# Patient Record
Sex: Female | Born: 2000 | Race: White | Hispanic: No | Marital: Single
Health system: Northeastern US, Academic
[De-identification: ages and names within clinical notes are randomized; demographics above are authoritative.]

---
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
## 2020-01-08 ENCOUNTER — Ambulatory Visit: Admitting: Student in an Organized Health Care Education/Training Program

## 2020-01-08 NOTE — Progress Notes (Signed)
* * *      Passey, Gloris **DOB:** [birth year] (19 yo F) **Acc No.** [id number] **DOS:**  01/08/2020    ---       Aundra Dubin**    ------    77 Y old Female, DOB: 05-Oct-2001    [address] [place], [place] [address]    Home: [phone]    Provider: Rolanda Jay        * * *    Telephone Encounter    ---    Answered by  Drue Novel Date: 01/08/2020       Time: 11:22 AM    Reason  appt reminder    ------            Action Taken                     Lewis,Trena  01/08/2020 11:22:46 AM > tried to outreach pt at [phone] not sure if number works tried to call it 3x's phone never called thru not ringing or anything. Couldn't outreach pt to remind about appt                    * * *                ---          * * *         Provider: Rolanda Jay 01/08/2020    ---    Note generated by eClinicalWorks EMR/PM Software ([email or web address])

## 2020-01-10 ENCOUNTER — Ambulatory Visit

## 2020-01-10 ENCOUNTER — Ambulatory Visit: Admitting: Student in an Organized Health Care Education/Training Program

## 2020-01-10 ENCOUNTER — Ambulatory Visit (HOSPITAL_BASED_OUTPATIENT_CLINIC_OR_DEPARTMENT_OTHER): Admitting: Psychiatry

## 2020-01-10 ENCOUNTER — Ambulatory Visit: Admit: 2020-01-10

## 2020-01-10 LAB — HX HEM-ROUTINE
HX BASO #: 0 10*3/uL (ref 0.0–0.2)
HX BASO: 1 %
HX EOSIN #: 0.1 10*3/uL (ref 0.0–0.5)
HX EOSIN: 2 %
HX HCT: 39.7 % (ref 32.0–45.0)
HX HGB: 12.6 g/dL (ref 11.0–15.0)
HX IMMATURE GRANULOCYTE#: 0 10*3/uL (ref 0.0–0.1)
HX IMMATURE GRANULOCYTE: 0 %
HX LYMPH #: 1.5 10*3/uL (ref 1.0–4.0)
HX LYMPH: 34 %
HX MCH: 27.6 pg (ref 26.0–34.0)
HX MCHC: 31.7 g/dL — ABNORMAL LOW (ref 32.0–36.0)
HX MCV: 87.1 fL (ref 80.0–98.0)
HX MONO #: 0.4 10*3/uL (ref 0.2–0.8)
HX MONO: 9 %
HX MPV: 9.6 fL (ref 9.1–11.7)
HX NEUT #: 2.4 10*3/uL (ref 1.5–7.5)
HX NRBC #: 0 10*3/uL
HX NUCLEATED RBC: 0 %
HX PLT: 272 10*3/uL (ref 150–400)
HX RBC BLOOD COUNT: 4.56 M/uL (ref 3.70–5.00)
HX RDW: 13.1 % (ref 11.5–14.5)
HX SEG NEUT: 54 %
HX WBC: 4.4 10*3/uL (ref 4.0–11.0)

## 2020-01-10 LAB — HX CHOLESTEROL
HX CHOLESTEROL: 172 mg/dL — ABNORMAL HIGH (ref 0–169)
HX HIGH DENSITY LIPOPROTEIN CHOL (HDL): 49 mg/dL (ref 35–75)
HX LDL: 101 mg/dL (ref 0–129)
HX TRIGLYCERIDES: 111 mg/dL (ref 20–149)

## 2020-01-10 LAB — HX CHEM-LIPIDS
HX CHOL-HDL RATIO: 3.5
HX CHOLESTEROL: 172 mg/dL — ABNORMAL HIGH (ref 0–169)
HX HIGH DENSITY LIPOPROTEIN CHOL (HDL): 49 mg/dL (ref 35–75)
HX HOURS FAST: 0 h
HX LDL: 101 mg/dL (ref 0–129)
HX TRIGLYCERIDES: 111 mg/dL (ref 20–149)

## 2020-01-10 LAB — HX HIV TESTING: HX HIV 1-2: NONREACTIVE

## 2020-01-10 MED ORDER — Ferrous Sulfate: 325 | 30 | Freq: Every day | 1 refills | 0 days | Status: AC

## 2020-01-10 MED ORDER — Naproxen: 500 | bottle | 0 refills | 0 days | Status: AC

## 2020-01-10 MED ORDER — Polyethylene Glycol 3350: 17 | 1 | Freq: Every day | 1 refills | 0 days | Status: AC

## 2020-01-10 NOTE — Progress Notes (Signed)
 .  Progress Notes  .  Patient: [name], [name]  Provider: Rolanda Jay    .DOB: [birth year] Age: 19 Y Sex: Female  .  PCP: Alphonzo Grieve, Glee Arvin  MD  Date: 01/10/2020  .  --------------------------------------------------------------------------------  .  REASON FOR APPOINTMENT  .  1. [facility]/ Heavy menstrual bleeding  .  HISTORY OF PRESENT ILLNESS  .  Depression Screening:  PHQ-2 (2015 Edition)  .  Marland Kitchen  Little interest or pleasure in doing things?Not at all  Feeling down, depressed, or hopeless?Not at all  Total Score0  .   [name] she is an 19 year old female with a medical history of  Panic attack on ( Escitalopram) General anxiety disorder  etsblished with a therapist present today for an concern of heavy  period bleeding x3 months. [name] reports she had her Neplanon  implanted on December 2021 . Two post insertion she reports she  has been experiencing heavy bleeding since she had her period on  10/25/2019. Sabella reports she change her pads every two hours ,  and sometimes blood soaked through her pads. 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  She states she is been feeling more tired, even this morning she  feel winded after walking from her Lyft to the clinic. She  reports lack of energy, taking more naps than usual. Also, she  reports having bilateral lower abdominal pain. constipation, and  Migraine. She takes Motrin which help with her pain. She denied  fever, back pain, diarrhea, nausea, vomiting. She is currently a  students at [employer] major in [employer].  .  Adolescent Med:  Current health concerns:  none.  .  Interval History:  negative.  .  CURRENT MEDICATIONS  .  Taking Lexapro  Taking Nexplanon  Taking Zyrtec  Medication List reviewed and reconciled with the patient  .  PAST MEDICAL HISTORY  .  Panic Attack  General Anxiety Disorder  .  ALLERGIES  .  N.K.D.A.  .  SURGICAL HISTORY  .  Knee surgery Nov/2020  .  FAMILY  HISTORY  .  Mother: alive, diagnosed with Diabetes mellitus without mention  of complication, type II or unspecified type, not stated as  uncontrolled  Father: alive  [place] smoker collapsed lung.  .  SOCIAL HISTORY  .  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 sleep : been  sleeping more.  Marland Kitchen  HOSPITALIZATION/MAJOR DIAGNOSTIC PROCEDURE  .  Knee surgery Nov 2020  .  REVIEW OF SYSTEMS  .  PED General:  .  General health description:    good .  Marland Kitchen  PED Behavioral/Developmental:  .  Body Image    no concerns .  Marland Kitchen  PED Constitutional:  .  Sleep    sleeping more than usual , no snoring, no apnea .  Marland Kitchen  PED Dermatology:  .  Acne No. Bruising No. Rash No.  .  PED HEENT/Resp:  .  HEENT    no concerns . Dental    regular care . Ears    normal  hearing . Cough No.  .  PED Cardiology:  .  Chest Pain No. Dizziness  No. Palpitations No.  .  PED GI/Nutrition:  .  Abdominal pain No. Diet    healthy diet . Constipation No.  Diarrhea No. Nausea No. Vomiting No.  .  PED Repro/Uro:  .  Urinary Frequency    no . Blood in Urine    no . Dysuria    no .  Marland Kitchen  PED Musculoskeletal/Neuro:  .  Headache    intermitent hedaches . Weakness No. Joint Pain No.  .  PED Endocrinology:  .  Endo Concerns    thyroid: no concerns .  Marland Kitchen  ADO OBGYN History:  .  Age at menarche:    10 . Cycle:    irregular . Flow:    heavy .  Duration:    experiencing heavy bleeding for 3 month .  Dysmenorrhea?    interfering with school/work . Treatment?     NSAIDs . Last chlamydia GC screen:    Last STI in december .  Marland Kitchen  ADO Psychosocial History:  .  Fighting:    no physical fights . Violence:    no safety concerns  . Weapons:    no weapons . Legal issues:    none . Abuse/assault:     no . Depression:    no  mood swings, no sadness . Suicide:     no suicidal ideation .  Marland Kitchen  ADO Anticipatory Guidance:  .  Physical growth and development:    Discussed: . Nutrition     Discussed . Social competence    Discussed:, relationships .  Violence prevention    Discussed: . Injury prevention     Discussed:, restraints . Alcohol use:    Discussed . Tobacco use:     Discussed . Illicit drug use:    Discussed . Pregnancy  prevention:    Discussed . STI prevention:    Discussed .  Marland Kitchen  VITAL SIGNS  .  Ht-in 64.72, Wt-lbs 145.72, BMI 24.46, BP 130/72, BSA 1.74, Ht-cm  164.4, Ht %ile 57.08, Wt-kg 66.1, Wt %ile 78.42, BMI Percentile  77.18LMp 1/2021Nexplanon.  Marland Kitchen  PHYSICAL EXAMINATION  .  GENERAL:  General Appearance:  well-developed, well-nourished, alert,  cooperative.  PEDS Dermatology:  Acne:  none.  Nevi:  none.  Rash:  none.  Tattoos:  none.  Body Piercing:  none.  PEDS HEENT:  Cervical Lymph Nodes:  not enlarged.  Conjunctiva:  normal.  Ear Canals:  clear.  Ears:  TMs normal.  Extraocular Movements:  intact.  Fundi:  normal.  Mouth:  moist mucous membranes, no oral lesions.  Neck:  supple w/ full ROM.  Nose:  nares patent.  Pharynx:  normal.  Teeth/Gums:  no caries.  Tonsillar Hypertrophy:  none.  PEDS Cardiovascular:  Heart Sounds:  normal.  Murmurs:  no.  Pulses:  peripheral 2+ and symmetric.  Rhythm:  regular.  PEDS Respiratory:  Chest:  symmetric.  Lungs:  symmetric breath sounds, no wheezes, no rales/crackles.  PEDS Abdomen:  Bowel Sounds:  normal.  Inguinal Lymph Nodes:  not enlarged.  Tenderness:  right lower Quadrant tenderness.  Organomegaly:  no.  PEDS GU: Female:  Cervical Motion Tenderness:  none.  Tanner Stage for Pubic Hair:  shaved, no external lesions, no  abnormal discharge, mild menstrual bleeding present.  PEDS Musculoskeletal:  Extremities:  full ROM.  PEDS Neurological:  Cerebellar:  intact.  Cranial Nerves:  normal.  Gait:  normal.  Mental Status:  oriented.  Reflexes:  2+, symmetric.  Tremor:   none.  Marland Kitchen  ASSESSMENTS  .  Well adolescent visit - Z00.129 (Primary)  .  Encounter for screening for infections with predominantly sexual  mode of transmission - Z11.3  .  Encounter for immunization - Z23  .  Excessive and frequent menstruation with irregular cycle - N92.1  .  Surveillance of implantable subdermal contraceptive - Z30.46  .  [name] she is an 19 year old female with a medical history of  Panic attack on ( Escitalopram) General anxiety disorder  etablished care with a therapist present today for an concern of  heavy period bleeding x3 months. Her most concerning symptoms is  her heavy bleeding most likely caused by her inplantable  subdermal contraceptive ( Nexplanon) .  Marland Kitchen  TREATMENT  .  Well adolescent visit  LAB: Lipid Profile (LIPID)  Normal Cholesterol     172     (0 - 169 - mg/dL)  High Density Lipoprotein Chol (HDL)     49     (35 - 75 - mg/dL)  LDL     725     (0 - 129 - mg/dL)  Chol-Hdl Ratio     3.5     ( - )  Triglycerides     111     (20 - 149 - mg/dL)  Hours Fast     0     ( - hours)  .  [facility] 01/10/2020 12:54:40 PM > Nonfasting WNL  .  Marland Kitchen  Notes: We will get a lipid profile today to rule out any Familial  Hypercholesterolemia.  .  .  Encounter for screening for infections with  predominantly sexual mode of transmission  LAB: HIV Ab/Ag Screen (HIV; Verbal Consent Req)  HIV 1-2     Non Reactive     ( - )  .  [facility] 01/13/2020 9:56:17 AM >  .  .  LAB: RPR/Syphillis (TRPAB)  .  LAB: Chlamydia DNA Prob (CTDN)  Chlamydia Source     URINE     ( - )  .  Marland Kitchen  LAB: Gonorrhea GC DNA Probe (GCDN)  GC Source     URINE     ( - )  .  Marland Kitchen  LAB: Trichomonas vag NAAT, Swab/Urine  Trichomonas Probe Source     URINE     ( - )  .  Marland Kitchen  Notes: Last STI screening was on December 2020 .She never had any  history of STI. She is sexually active with two partners . Likes  female and female , she reports  using condom all the time.  .  .  Encounter for immunization  Notes: Immunization are  uptodate.  .  .  Excessive and frequent menstruation with irregular  cycle  Start Naproxen Tablet, 500 MG, 1 tablet with food or milk as  needed, Orally, every 12 hrs, 7 days, 1 bottle, Refills 0  Start Polyethylene Glycol 3350 Powder, 17 GM/SCOOP, as directed,  Orally, Once a day, 10 days, 1, Refills 1  Start Ferrous Sulfate Tablet, 325 (65 Fe) MG, 1 tablet, Orally,  Once a day, 30 day(s), 30, Refills 1  LAB: CBC/DIFF with PLT (CBCWD)  WBC     4.4     (4.0 - 11.0 - K/uL)  RBC     4.56     (3.70 - 5.00 - M/uL)  HGB     12.6     (11.0 - 15.0 - g/dL)  HCT     36.6     (44.0 -  45.0 - %)  MCV     87.1     (80.0 - 98.0 - fL)  MCH     27.6     (26.0 - 34.0 - pg)  MCHC     31.7     (32.0 - 36.0 - g/dL)  RDW     16.0     (73.7 - 14.5 - %)  PLT     272     (150 - 400 - K/uL)  MPV     9.6     (9.1 - 11.7 - fL)  SEG NEUT     54     ( - %)  LYMPH     34     ( - %)  MONO     9     ( - %)  EOS     2     ( - %)  BASO     1     ( - %)  NEUT #     2.4     (1.5 - 7.5 - K/uL)  LYMPH #     1.5     (1.0 - 4.0 - K/uL)  MONO #     0.4     (0.2 - 0.8 - K/uL)  EOSIN #     0.1     (0.0 - 0.5 - K/uL)  BASO #     0.0     (0.0 - 0.2 - K/uL)  Imm Grnas     0     ( - %)  NRBC     0     ( - %)  Imm Grans, Abs     0.0     (0.0 - 0.1 - K/uL)  NRBC, Abs     0.0     (<0.0 - K/uL)  .  [facility] 01/10/2020 12:43:13 PM > mild anemia  .  Marland Kitchen  Notes: [name] has heavy bleeding since January first . We  believe it is a sid effect of the Nexplanon . This is because her  peiod was regular 7 to 9 days before she received her Nexplanon.  We will treat her with Naproxen to stop the heavy bleeding . She  also report of feeling constipated , and we believe the  constipation can makes her symptoms worse. Therefore we advised  her to take Miralax , and stop as soon as she start having soft  bowel movement . She report recent onset of fatigue most likely  from blood loss due to heavy bleeding . We will obtain an CBC to  look at her H/H and order Ferrous  sulfate. We educated her about  Iron turning stool dark , and causing constipation. We advised  her to call back if the bleeding don't stop with Naproxen so we  can go ahead prescribe a COC.  .  .  Others  Notes: Note prepared with assistance from Madiop Audrea Muscat, RN, NP  student.  Marland Kitchen  PROCEDURES  .  Gen Peds Care Plan - Sexually Active Adolescent  GOAL: Medication Compliance    No Medication needed  GOAL: Maintain adolescent confidentiality    Patient will  understand means provider takes to maintain patient's  confidentiality  GOAL: Adopt Healthy Behaviors    Counseled regarding Safe Sex  practices: Should always use condoms  Self-Care Plan:    Created action plan to reduce risk of  pregnancy and STDs  Assessment of Barriers to Reaching Treatment Goals:  No  barriers identified  Success Meeting Treatment Goals:    Meeting goals, pt to maintain  self-care plan  Educational Programs, Resources, Handouts:    Age-appropriate  handout provided  .  PREVENTIVE MEDICINE  .  Care Plans:  .  [facility] Care Plan  Completed? Yes  .  PROCEDURE CODES  .  96045 Developmental Testing (per hour) (W0981), Modifiers: U1  .  19147 HPV Vaccine (HPV9)  .  82956 FLU VAC NO PRSV 4 VAL 3 YRS+  .  21308 Prob focus-Straight  .  FOLLOW UP  .  2 Weeks (Reason: irregular bleeding on Nexplanon)  .  Electronically signed by Rolanda Jay on  01/13/2020 at 10:58 AM EDT  .  Document electronically signed by Rolanda Jay    .

## 2020-01-10 NOTE — Progress Notes (Signed)
 * * *    [name], [name] **DOB:** [birth year] (19 yo F) **Acc No.** [id number] **DOS:**  01/10/2020    ---       [name], [name]**    ------    1 Y old Female, DOB: 2001-07-27, External MRN: [id number]    Account Number: [id number]    [address], [address], [place], [id number]    Home: [phone]    Insurance: [facility] OUT OF STATE    PCP: [name] Jay, MD    Appointment Facility: [facility]        * * *    01/10/2020 Progress Notes: [name] **CHN#:** [id number]    ------    ---       **Current Medications**    ---    Taking    * Lexapro     ---    * Nexplanon     ---    * Zyrtec     ---    * Medication List reviewed and reconciled with the patient    ---      **Past Medical History**    ---      Panic Attack.        ---    General Anxiety Disorder.        ---      **Surgical History**    ---      Knee surgery Nov/2020    ---      **Family History**    ---      Mother: alive, diagnosed with Diabetes mellitus without mention of  complication, type II or unspecified type, not stated as uncontrolled    ---    Father: alive    ---    Dad healthy    [place] smoker collapsed lung.    ---      **Social History**    ---      Lives in the Dorm at [facility]    Major in dance perfomance    Unemployed    Denied smoking marijuana, cigarettes, alcohol, hroin , coacaine    Currently Dating , sexually active , 2 partners , like both men and women uses  condom last STI test December    She loves to work out at [employer] at campus , denied SI.    sleep : been sleeping more.    ---      **Allergies**    ---      N.K.D.A.    ---      **Hospitalization/Major Diagnostic Procedure**    ---      Knee surgery Nov 2020    ---      **Review of Systems**    ---     _PED General_ :    General health description: good.    _PED Behavioral/Developmental_ :    Body Image no concerns.    _PED Constitutional_ :    Sleep  sleeping more than usual , no snoring, no apnea.    _PED Dermatology_ :    Acne No.  Bruising No. Rash No.    _PED HEENT/Resp_ :    HEENT no concerns. Dental regular care. Ears normal hearing. Cough No.    _PED Cardiology_ :    Chest Pain No. Dizziness No. Palpitations No.    _PED GI/Nutrition_ :    Abdominal pain No. Diet healthy diet. Constipation No. Diarrhea No. Nausea No.  Vomiting No.    _PED Repro/Uro_ :    Urinary  Frequency no. Blood in Urine no. Dysuria no.    _PED Musculoskeletal/Neuro_ :    Headache intermitent hedaches. Weakness No. Joint Pain No.    _PED Endocrinology_ :    Endo Concerns thyroid: no concerns.    _ADO OBGYN History_ :    Age at menarche: 10. Cycle: irregular. Flow: heavy. Duration: experiencing  heavy bleeding for 3 month. Dysmenorrhea? interfering with school/work.  Treatment? NSAIDs. Last chlamydia GC screen: Last STI in december.    _ADO Psychosocial History_ :    Fighting: no physical fights. Violence: no safety concerns. Weapons: no  weapons. Legal issues: none. Abuse/assault: no. Depression: no mood swings, no  sadness. Suicide: no suicidal ideation.    _ADO Anticipatory Guidance_ :    Physical growth and development: Discussed:. Nutrition Discussed. Social  competence Discussed:, relationships. Violence prevention Discussed:. Injury  prevention Discussed:, restraints. Alcohol use: Discussed. Tobacco use:  Discussed. Illicit drug use: Discussed. Pregnancy prevention: Discussed. STI  prevention: Discussed.          **Reason for Appointment**    ---      1\. [facility]/ Heavy menstrual bleeding    ---      **Assessments**    ---    1\. Well adolescent visit - Z00.129 (Primary)    ---    2\. Encounter for screening for infections with predominantly sexual mode of  transmission - Z11.3    ---    3\. Encounter for immunization - Z23    ---    4\. Excessive and frequent menstruation with irregular cycle - N92.1    ---    5\. Surveillance of implantable subdermal contraceptive - Z30.46    ---     [name] she is an 19 year old female with a medical  history of Panic attack  on ( Escitalopram) General anxiety disorder etablished care with a therapist  present today for an concern of heavy period bleeding x3 months. Her most  concerning symptoms is her heavy bleeding most likely caused by her  inplantable subdermal contraceptive ( Nexplanon) .    ---      **Treatment**    ---      **1\. Well adolescent visit**    _LAB: Lipid Profile (LIPID)_ Normal   Value Reference Range    ---------    Cholesterol 172 H 0 - 169 - mg/dL    High Density Lipoprotein Chol (HDL) 49  35 - 75 - mg/dL    ------------    LDL 101  0 - 129 - mg/dL    ------------    Chol-Hdl Ratio 3.5   \-    ------------    Triglycerides 111  20 - 149 - mg/dL    ------------    Hours Fast 0   \- hours    ------------     Notes :[name],[name] 01/10/2020 12:54:40 PM > Nonfasting WNL    ------        Notes: We will get a lipid profile today to rule out any Familial  Hypercholesterolemia.        **2\. Encounter for screening for infections with predominantly sexual mode of  transmission**    _LAB: HIV Ab/Ag Screen (HIV; Verbal Consent Req)_   Value Reference Range    ---------    HIV 1-2 Non Reactive   \-     Notes :[name],[name] 01/13/2020 9:56:17 AM >    ------    _LAB: RPR/Syphillis (TRPAB)_    _LAB: Chlamydia DNA Prob (CTDN)_  Value Reference  Range    ---------    Chlamydia Source URINE   \-    _LAB: Gonorrhea GC DNA Probe (GCDN)_  Value Reference Range    ---------    GC Source URINE   \-    _LAB: Trichomonas vag NAAT, Swab/Urine_  Value Reference Range    ---------    Trichomonas Probe Source URINE   \-        Notes: Last STI screening was on December 2020 .She never had any history of  STI. She is sexually active with two partners . Likes female and female , she  reports    using condom all the time.        **3\. Encounter for immunization**    Notes: Immunization are uptodate.        **4\. Excessive and frequent  menstruation with irregular cycle**    Start Naproxen Tablet, 500 MG, 1 tablet with food or milk as needed, Orally,  every 12 hrs, 7 days, 1 bottle, Refills 0    Start Polyethylene Glycol 3350 Powder, 17 GM/SCOOP, as directed, Orally, Once  a day, 10 days, 1, Refills 1    Start Ferrous Sulfate Tablet, 325 (65 Fe) MG, 1 tablet, Orally, Once a day, 30  day(s), 30, Refills 1    _LAB: CBC/DIFF with PLT (CBCWD)_   Value Reference Range    ---------    WBC 4.4  4.0 - 11.0 - K/uL    RBC 4.56  3.70 - 5.00 - M/uL    ------------    HGB 12.6  11.0 - 15.0 - g/dL    ------------    HCT 39.7  32.0 - 45.0 - %    ------------    MCV 87.1  80.0 - 98.0 - fL    ------------    MCH 27.6  26.0 - 34.0 - pg    ------------    MCHC 31.7 L 32.0 - 36.0 - g/dL    ------------    RDW 13.1  11.5 - 14.5 - %    ------------    PLT 272  150 - 400 - K/uL    ------------    MPV 9.6  9.1 - 11.7 - fL    ------------    SEG NEUT 54   \- %    ------------    LYMPH 34   \- %    ------------    MONO 9   \- %    ------------    EOS 2   \- %    ------------    BASO 1   \- %    ------------    NEUT # 2.4  1.5 - 7.5 - K/uL    ------------    LYMPH # 1.5  1.0 - 4.0 - K/uL    ------------    MONO # 0.4  0.2 - 0.8 - K/uL    ------------    EOSIN # 0.1  0.0 - 0.5 - K/uL    ------------    BASO # 0.0  0.0 - 0.2 - K/uL    ------------    Imm Grnas 0   \- %    ------------    NRBC 0   \- %    ------------    Imm Grans, Abs 0.0  0.0 - 0.1 - K/uL    ------------    NRBC, Abs 0.0  <0.0 - K/uL    ------------     Notes :[name],[name] 01/10/2020 12:43:13 PM > mild anemia    ------  Notes: [name] has heavy bleeding since January first . We believe it is a  sid effect of the Nexplanon . This is because her peiod was regular 7 to 9  days before she received her Nexplanon. We will treat her with Naproxen to  stop  the heavy bleeding . She also report of feeling constipated , and we  believe the constipation can makes her symptoms worse. Therefore we advised  her to take Miralax , and stop as soon as she start having soft bowel movement  . She report recent onset of fatigue most likely from blood loss due to heavy  bleeding . We will obtain an CBC to look at her H/H and order Ferrous sulfate.  We educated her about Iron turning stool dark , and causing constipation. We  advised her to call back if the bleeding don't stop with Naproxen so we can go  ahead prescribe a COC.        **5\. Others**    Notes: Note prepared with assistance from Madiop Audrea Muscat, RN, NP student.     **Procedures**    ---     _Gen Peds Care Plan - Sexually Active Adolescent_ :    GOAL: Medication Compliance No Medication needed.    GOAL: Maintain adolescent confidentiality Patient will understand means  provider takes to maintain patient's confidentiality.    GOAL: Adopt Healthy Behaviors Counseled regarding Safe Sex practices: Should  always use condoms.    Self-Care Plan: Created action plan to reduce risk of pregnancy and STDs.    Assessment of Barriers to Reaching Treatment Goals: No barriers identified.    Success Meeting Treatment Goals: Meeting goals, pt to maintain self-care plan.    Educational Programs, Resources, Handouts: Mining engineer provided.         **Immunization**    ---      HPV Vaccine (HPV9) : 0.5 mL (Route: Intramuscular) given by Horald Chestnut on Left Deltoid (Encounter for immunization)    ---    Flu Vaccine : 0.5 mL (Route: Intramuscular) given by Horald Chestnut on Left  Deltoid (Encounter for immunization)    ---      **Preventive Medicine**    ---      Care Plans:    SAA Care Plan    Completed? _Yes_    ---      **Follow Up**    ---    2 Weeks (Reason: irregular bleeding on Nexplanon)      **History of Present Illness**    ---     _Depression Screening_ :    PHQ-2 (2015 Edition)    Little interest or  pleasure in doing things? _Not at all_    Feeling down, depressed, or hopeless? _Not at all_    Total Score _0_    [name] she is an 19 year old female with a medical history of Panic attack  on ( Escitalopram) General anxiety disorder etsblished with a therapist  present today for an concern of heavy period bleeding x3 months. [name]  reports she had her Neplanon implanted on December 2021 . Two post insertion  she reports she has been experiencing heavy bleeding since she had her period  on 10/25/2019. [name] reports she change her pads every two hours , and  sometimes blood soaked through her pads. 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 She states she is been  feeling more tired, even this morning she feel winded  after walking from her  Lyft to the clinic. She reports lack of energy, taking more naps than usual.  Also, she reports having bilateral lower abdominal pain. constipation, and  Migraine. She takes Motrin which help with her pain. She denied fever, back  pain, diarrhea, nausea, vomiting. She is currently a students at [employer] major  in [employer].     _Adolescent Med_ :    Current health concerns: none.    Interval History: negative.     **Vital Signs**    ---    Ht-in 64.72, Wt-lbs 145.72, BMI 24.46, BP 130/72, BSA 1.74, Ht-cm 164.4, Ht  %ile 57.08, Wt-kg 66.1, Wt %ile 78.42, BMI Percentile 77.18    LMp 10/2019    Nexplanon.      **Physical Examination**    ---     _GENERAL_ :    General Appearance: well-developed, well-nourished, alert, cooperative.    _PEDS Dermatology_ :    Acne: none.    Nevi: none.    Rash: none.    Tattoos: none.    Body Piercing: none.    _PEDS HEENT_ :    Cervical Lymph Nodes: not enlarged.    Conjunctiva: normal.    Ear Canals: clear.    Ears: TMs normal.    Extraocular Movements: intact.    Fundi: normal.    Mouth: moist mucous membranes, no oral lesions.    Neck: supple w/ full ROM.    Nose: nares patent.    Pharynx:  normal.    Teeth/Gums: no caries.    Tonsillar Hypertrophy: none.    _PEDS Cardiovascular_ :    Heart Sounds: normal.    Murmurs: no.    Pulses: peripheral 2+ and symmetric.    Rhythm: regular.    _PEDS Respiratory_ :    Chest: symmetric.    Lungs: symmetric breath sounds, no wheezes, no rales/crackles.    _PEDS Abdomen_ :    Bowel Sounds: normal.    Inguinal Lymph Nodes: not enlarged.    Tenderness: right lower Quadrant tenderness.    Organomegaly: no.    _PEDS GU: Female_ :    Cervical Motion Tenderness: none.    Tanner Stage for Pubic Hair: shaved, no external lesions, no abnormal  discharge, mild menstrual bleeding present.    _PEDS Musculoskeletal_ :    Extremities: full ROM.    _PEDS Neurological_ :    Cerebellar: intact.    Cranial Nerves: normal.    Gait: normal.    Mental Status: oriented.    Reflexes: 2+, symmetric.    Tremor: none.         **Procedure Codes**    ---      [id number] Developmental Testing (per hour) (Z6109), Modifiers: U1    ---    60454 HPV Vaccine (HPV9)    ---    09811 FLU VAC NO PRSV 4 VAL 3 YRS+    ---    91478 Prob focus-Straight    ---    Electronically signed by [name] on 01/13/2020 at 10:58 AM EDT    Sign off status: Completed        * * *        Adolescent Medicine    [address] [place], [address]    [place], [place] [address]    Tel: [phone]    Fax: [phone]              * * *  Progress Note: [name] 01/10/2020    ---    Note generated by eClinicalWorks EMR/PM Software ([email or web address])

## 2020-01-12 LAB — HX IMMUNOLOGY
HX HIV 1-2: NONREACTIVE
HX TREPONEMAL ANTIBODY: NONREACTIVE

## 2020-01-13 ENCOUNTER — Ambulatory Visit: Admitting: Student in an Organized Health Care Education/Training Program

## 2020-01-13 LAB — HX CHLAMYDIA: HX CHLAMYDIA DNA PROBE: NEGATIVE

## 2020-01-13 NOTE — Progress Notes (Signed)
* * *      [name], [name] **DOB:** 09/05/2001 (19 yo F) **Acc No.** [id number] **DOS:**  01/13/2020    ---       [name]**    ------    24 Y old Female, DOB: 01/26/2001    [address], [place], [place] [address]    Home: [phone]    Provider: Rolanda Jay        * * *    Telephone Encounter    ---    Answered by  Drue Novel Date: 01/13/2020       Time: 02:54 PM    Reason  P.R.A.P.A.R.E.    ------            Action Taken                     Drue Novel  01/13/2020 2:54:50 PM >                    * * *                ---          * * *         Provider: Rolanda Jay 01/13/2020    ---    Note generated by eClinicalWorks EMR/PM Software ([email or web address])

## 2020-01-14 LAB — HX MOLECULAR BIO
HX CHLAMYDIA DNA PROBE: NEGATIVE
HX CHLAMYDIA DNA PROBE: NEGATIVE
HX GC DNA PROBE: NEGATIVE
HX GC DNA PROBE: NEGATIVE
HX TRICHOMONAS, INTERPRETATION: NEGATIVE
HX TRICHOMONAS, INTERPRETATION: NEGATIVE

## 2020-01-20 ENCOUNTER — Ambulatory Visit: Admitting: Student in an Organized Health Care Education/Training Program

## 2020-01-20 NOTE — Progress Notes (Signed)
* * *      [name], [name] **DOB:** 2001-02-04 (19 yo F) **Acc No.** [id number] **DOS:**  01/20/2020    ---       [name]**    ------    58 Y old Female, DOB: 2000/12/29    [address], [place], [place] [address]    Home: [phone]    Provider: Rolanda Jay        * * *    Telephone Encounter    ---    Answered by  Leo Grosser Date: 01/20/2020       Time: 10:45 AM    Reason  Reminder Call    ------            Message                     Apt confirmed with patient.                     * * *                ---          * * *         Provider: Rolanda Jay 01/20/2020    ---    Note generated by eClinicalWorks EMR/PM Software ([email or web address])

## 2020-01-22 ENCOUNTER — Ambulatory Visit: Admitting: Student in an Organized Health Care Education/Training Program

## 2020-01-22 ENCOUNTER — Ambulatory Visit: Admit: 2020-01-22

## 2020-01-22 MED ORDER — Levonorgestrel-Ethinyl Estrad: 30 | Freq: Every day | 1 refills | 0 days | Status: AC

## 2020-01-22 NOTE — Progress Notes (Signed)
 .  Progress Notes  .  Patient: [name], [name]  Provider: Rolanda Jay    .DOB: Jan 29, 2001 Age: 19 Y Sex: Female  .  PCP: Alphonzo Grieve, Glee Arvin  MD  Date: 01/22/2020  .  --------------------------------------------------------------------------------  .  REASON FOR APPOINTMENT  .  1. IRREGULAR BLEEDING  .  HISTORY OF PRESENT ILLNESS  .  GENERAL:  18yo with nexplanon in place presents for  followup of irregular bleeding. We last saw her 2 weeks ago and  prescribed scheduled Naproxen for 7 days. She reports she took 7  days of BID Naproxen, her bleeding stopped for 4 days during that  time and then restarted heavily. She reports bleeding has been  heavy every day since then. She doesn't think she needs the  Nexplanon out.She went back to dancing today and it went well.She  had long and heavy periods before the Nexplanon. She denies  bleeding with teeth brushing and nosebleeds, no bleeding during  surgery in November. She denies migraines, but used to get  headaches the week before her periods. She took the pill in the  past, sometehing with a red case.She also was constipated and we  prescribed miralax. Her stool has been regular. She goes to bed  around 11pm and reports she has always been a bad sleeper,  getting 6-7 hours a night. She uses her phone for a bit but then  turns it on silent and face down and doesn't look at it. Has  tried Headspace and Calm to no avail.This is a TeleHealth  visit.The patient and guardian understand that by participating  in this Telehealth visit they give consent to its use.The  Telehealth used included video and audio.The Telehealth system is  being used due to the current Covid-19 pandemic and the federally  declared state of public health emergency.The patient and  guardian are at the above home address.The provider (me) is at  [facility], [address] [place],  [place].Participant are: [name].  .  CURRENT MEDICATIONS  .  Taking Ferrous Sulfate 325 (65  Fe) MG Tablet 1 tablet Orally Once  a day  Taking Lexapro  Taking Naproxen 500 MG Tablet 1 tablet with food or milk as  needed Orally every 12 hrs  Taking Nexplanon  Taking Polyethylene Glycol 3350 17 GM/SCOOP Powder as directed  Orally Once a day  Taking Zyrtec  .  ALLERGIES  .  yes[Allergies Verified]  .  REVIEW OF SYSTEMS  .  GENERAL:  .  Outpatient questionnaire reviewed. Constitutional    No  fevers/chills/weight loss . Hematologic/Lymphatic No lumps in the  neck. Eyes    No visual problems, eye pain, blurry vision or  diplopia . ENT    No sore throat, nasal discharge, hearing loss .  Cardiovascular    No chest pain, palpitations, lightheadedness .  Respiratory    No shortness of breath, wheezing .  Gastrointestinal    No abdominal pain/nausea/vomiting/bowel  changes . Genitourinary    No dysuria, hematuria, urinary  incontinence . Integumentary    No rash or skin lesions .  Neurological    No tremors, headaches, numbness . Endocrine    No  changes in heat/cold intolerance, thirst, or hair loss .  Hematologic/Lymphatic    No easy bruising or bleeding .  Psychiatry    see HPI .  Marland Kitchen  VITAL SIGNS  .  Limited by telehealth.  .  PHYSICAL EXAMINATION  .  GENERAL:  General Appearance:  well-nourished, well-groomed,  no acute  distress, alert and oriented, pleasant.  HEENT  moist oral mucosa, neck supple, good dentition.  Skin  no acne.  Cardiovascular  no peripheral edema or discoloration.  Lungs  no respiratory distress, breathing comfortably.  .  ASSESSMENTS  .  Excessive and frequent menstruation with irregular cycle - N92.1  (Primary)  .  TREATMENT  .  Excessive and frequent menstruation with irregular  cycle  Start Levonorgestrel-Ethinyl Estrad Tablet, 90-20 MCG, 1 tablet,  Orally, Once a day for 10 days, stop for 5 days, then 20 days if  bleeding persists, 30 days, 30, Refills 1  Notes: [name] is a 18yo with Nexplanon in place who presents  for followup of heavy, irregular bleeding since Nexplanon  was  placed.  .  Our trial of NSAIDs for bleeding was temporarily sucessful and  her lifestyle has mildly improved but she remains with  intolerable bleeding. We will try 10 days of COCs and then 20  days if necessary. Counseled her and will followup in 1 month.  Unlikely to be caused by bleeding disorder since she has had  surgery and no other signs of bleeding. Concern for fibroids but  this is unlikely in her age. We will continue to follow.  .  .  Others  Notes: Length of Visit was 19 min.  More then 50% time spent on discussing the patients Dx,  condition, and Tx.  .  FOLLOW UP  .  4 Weeks (Reason: irregular bleeding)  .  Electronically signed by Rolanda Jay on  01/22/2020 at 12:14 PM EDT  .  Document electronically signed by Rolanda Jay    .

## 2020-01-22 NOTE — Progress Notes (Signed)
 * * *    [name], [name] **DOB:** 2000/11/13 (19 yo F) **Acc No.** [id number] **DOS:**  01/22/2020    ---       [name] [name], [name]**    ------    87 Y old Female, DOB: January 08, 2001, External MRN: [id number]    Account Number: [id number]    [address], [place], [id number]    Home: [phone]    Insurance: [facility] OUT OF STATE    PCP: Rolanda Jay, MD    Appointment Facility: [facility] Medicine        * * *    01/22/2020 Progress Notes: Rolanda Jay **CHN#:** [id number]    ------    ---       **Current Medications**    ---    Taking    * Ferrous Sulfate 325 (65 Fe) MG Tablet 1 tablet Orally Once a day    ---    * Lexapro     ---    * Naproxen 500 MG Tablet 1 tablet with food or milk as needed Orally every 12 hrs    ---    * Nexplanon     ---    * Polyethylene Glycol 3350 17 GM/SCOOP Powder as directed Orally Once a day    ---    * Zyrtec     ---     **Review of Systems**    ---     _GENERAL_ :    Outpatient questionnaire reviewed. Constitutional No fevers/chills/weight  loss. Hematologic/Lymphatic No lumps in the neck. Eyes No visual problems, eye  pain, blurry vision or diplopia. ENT No sore throat, nasal discharge, hearing  loss. Cardiovascular No chest pain, palpitations, lightheadedness. Respiratory  No shortness of breath, wheezing. Gastrointestinal No abdominal  pain/nausea/vomiting/bowel changes. Genitourinary No dysuria, hematuria,  urinary incontinence. Integumentary No rash or skin lesions. Neurological No  tremors, headaches, numbness. Endocrine No changes in heat/cold intolerance,  thirst, or hair loss. Hematologic/Lymphatic No easy bruising or bleeding.  Psychiatry see HPI.          **Reason for Appointment**    ---      1\. IRREGULAR BLEEDING    ---      **Assessments**    ---    1\. Excessive and frequent menstruation with irregular cycle - N92.1 (Primary)    ---      **Treatment**    ---      **1\. Excessive and frequent menstruation with irregular cycle**    Start  Levonorgestrel-Ethinyl Estrad Tablet, 90-20 MCG, 1 tablet, Orally, Once  a day for 10 days, stop for 5 days, then 20 days if bleeding persists, 30  days, 30, Refills 1    Notes: [name] is a 18yo with Nexplanon in place who presents for followup of  heavy, irregular bleeding since Nexplanon was placed.        Our trial of NSAIDs for bleeding was temporarily sucessful and her lifestyle  has mildly improved but she remains with intolerable bleeding. We will try 10  days of COCs and then 20 days if necessary. Counseled her and will followup in  1 month.    Unlikely to be caused by bleeding disorder since she has had surgery and no  other signs of bleeding. Concern for fibroids but this is unlikely in her age.  We will continue to follow.    ---        **2\. Others**    Notes: Length of Visit was 19 min.    More then  50% time spent on discussing the patients Dx, condition, and Tx.     **Follow Up**    ---    4 Weeks (Reason: irregular bleeding)      **History of Present Illness**    ---     _GENERAL_ :    18yo with nexplanon in place presents for followup of irregular bleeding.    We last saw her 2 weeks ago and prescribed scheduled Naproxen for 7 days. She  reports she took 7 days of BID Naproxen, her bleeding stopped for 4 days  during that time and then restarted heavily.    She reports bleeding has been heavy every day since then. She doesn't think  she needs the Nexplanon out.    She went back to dancing today and it went well.    She had long and heavy periods before the Nexplanon. She denies bleeding with  teeth brushing and nosebleeds, no bleeding during surgery in November. She  denies migraines, but used to get headaches the week before her periods. She  took the pill in the past, sometehing with a red case.    She also was constipated and we prescribed miralax. Her stool has been  regular.    She goes to bed around 11pm and reports she has always been a bad sleeper,  getting 6-7 hours a night. She uses her  phone for a bit but then turns it on  silent and face down and doesn't look at it. Has tried Headspace and Calm to  no avail.    This is a TeleHealth visit.    The patient and guardian understand that by participating in this Telehealth  visit they give consent to its use.    The Telehealth used included video and audio.    The Telehealth system is being used due to the current Covid-19 pandemic and  the federally declared state of public health emergency.    The patient and guardian are at the above home address.    The provider (me) is at [facility], [address],  [place], [place].    Participant are: [name].      **Vital Signs**    ---    Limited by telehealth.      **Physical Examination**    ---     _GENERAL_ :    General Appearance: well-nourished, well-groomed, no acute distress, alert and  oriented, pleasant.    HEENT moist oral mucosa, neck supple, good dentition.    Skin no acne.    Cardiovascular no peripheral edema or discoloration.    Lungs no respiratory distress, breathing comfortably.        Electronically signed by Rolanda Jay on 01/22/2020 at 12:14 PM EDT    Sign off status: Completed        * * *        Adolescent Medicine    [address] [place], [address]    [place], [place] [address]    Tel: [phone]    Fax: [phone]              * * *          Progress Note: Rolanda Jay 01/22/2020    ---    Note generated by eClinicalWorks EMR/PM Software ([email or web address])

## 2020-07-02 ENCOUNTER — Ambulatory Visit: Admitting: Emergency Medicine

## 2020-07-02 ENCOUNTER — Emergency Department: Admit: 2020-07-02 | Discharge: 2020-07-02 | Disposition: A | Discharge status: Home / Self Care

## 2020-07-02 NOTE — ED Provider Notes (Signed)
 [name]  Name: [name], [name]  MRN: [id number]  Age: 19 yrs  Sex: Female  DOB: November 26, 2000  Arrival Date: 07/02/2020  Arrival Time: 11:44  Account#: [id number]  .  Working Diagnosis: Sprain of foot - left  PCP: Alphonzo Grieve, Maayan  .  Historical:  - Allergies: No known drug Allergies;  - Home Meds: escitalopram oxalate 10 mg oral tab once daily; oral    contraception;  - PMHx: Anxiety;  - PSHx: Knee Surgery;  - Social history: Smoking status: The patient is not a current    smoker. Patient/guardian denies using alcohol, street drugs,    IV drugs.  .  .  OB/GYN:  09/09  11:54 LMP 06/20/2020                                                   dn8  .  Vital Signs:  11:54 BP 140 / 84; Pulse 98; Resp 18; Temp 36.4(TE); Pulse Ox 100% on dn8        R/A;  .  Glasgow Coma Score:  11:54 Eye Response: spontaneous(4). Verbal Response: oriented(5).     dn8        Motor Response: obeys commands(6). Total: 15.  .  MDM:  .  09/09  12:18 Order name: Dx Foot Ap + Lateral- 2 Views                       mm10  .  Radiology Orders:  Order Name: Dx Foot Ap + Lateral- 2 Views; Last Status:    Returned; Time: 07/02/20 12:18; By: Annie Sable; For: mm10; Order    Method: Electronic; Notes: Bed Name: D5  Attending Notes:  12:40 Attending HPI: Social History: Nonsmoker Family History: No one mm10        sick at home HPI: This patient is a 20 year old female she is a        ballerina in the [employer] she was standing on her        toes when she felt some "cracks" in her left metatarsal she        notes pain and tenderness in the fifth and fourth metatarsal of        her left foot. There is mild tenderness a little bit of        swelling good distal pulses no breaks in the skin there is no        one numbness tingling or weakness no other symptoms or        associated symptoms. It is aggravated by walking and standing  .  Name:[name], [name]  [name]:[id number]  [id number]  Page 1 of 2  %%PAGE  .  Name: [name], [name]  MRN: [id number]  Age: 21  yrs  Sex: Female  DOB: 09-Jul-2001  Arrival Date: 07/02/2020  Arrival Time: 11:44  Account#: [id number]  .  Working Diagnosis: Sprain of foot - left  PCP: Alphonzo Grieve, Maayan  .        on it and it is relieved by rest. Attending ROS Constitutional:        No fever. Eyes: no visual complaint ENT: no hearing loss ,        runny nose or sore throat Neck: No neck pain Cardiovascular: No        chest pain or  palpitations Respiratory: No cough or SOB        Abdomen/GI: No abd pain, nausea or vomiting Skin: No rash        Neuro: No weakness or numbness MS/Extremity: Positive for Left        foot pain and tenderness. Attending Exam: My personal exam        reveals The left foot has tenderness in the fourth and fifth        metatarsal. Constitutional: Non toxic no distress Head/Face:        NC/AT Respiratory: Lung CTA bilaterally. Chest/axilla: Chest        normal AP diamenter. Cardiovascular: RRR, No S3 S4 or Murmur        Abdomen/GI: Abd soft non tender active BS all quads, No CVA        tenderness. I have reviewed Nurses Notes.  .  Disposition Summary:  07/02/20 13:00  Discharge Ordered        Location: Home -                                                mm10        Problem: new                                                    mm10        Symptoms: have improved                                         mm10        Condition: Stable                                               mm10        Diagnosis          - Sprain of foot - left                                       mm10        Followup:                                                       mm10          - With: [facility] [place]          - When: Next week          - Reason: Continuance of care        Discharge Instructions:          - Discharge Summary Sheet  mm10          - SPRAIN FOOT                                                 mm10        Forms:          - Medication Reconciliation Form                              mm10           - Fax Summary                                                 mm10  Signatures:  Dispatcher, [employer]                          dispa  Mostofi, Matt                           DO   mm10  Nicolosi, Domenic                       RN   dn8  .  Document is preliminary until electronically or manually signed by the atte  nding physician  .  [name]  Name:[name], [name] Woodrow  [id number]  Page 2 of 2  .  %%END

## 2020-07-02 NOTE — ED Provider Notes (Signed)
 Marland Kitchen  Name: [name], [name]  MRN: [id number]  Age: 19 yrs  Sex: Female  DOB: 28-Dec-2000  Arrival Date: 07/02/2020  Arrival Time: 11:44  Account#: [id number]  Bed D5  PCP: Alphonzo Grieve, Glee Arvin  Chief Complaint:  - Left foot injury  .  Presentation:  09/09  11:50 Presenting complaint: Patient states: pt states she was walking dn8        today and her foot "cracked" and went numb for a minute and        also felt some burning. hurts when walking and states she has        full sensation at this time.  11:50 Method Of Arrival: Walk In                                      dn8  11:50 Acuity: Peds 4                                                  dn8  .  Historical:  - Allergies:  11:51 No known drug Allergies;                                        dn8  - Home Meds:  11:51 escitalopram oxalate 10 mg oral tab once daily [Active]; oral   dn8        contraception [Active];  - PMHx:  11:51 Anxiety;                                                        dn8  - PSHx:  11:51 Knee Surgery;                                                   dn8  .  - Social history: Smoking status: The patient is not a current    smoker. Patient/guardian denies using alcohol, street drugs,    IV drugs.  .  .  Screening:  11:54 Fall Risk None identified. Exposure Risk/Travel Screening:      dn8        COVID 19 Vaccinequestion Yes-patient states they completed COVID        vaccine recommendations over 2 weeks ago.  11:56 SEPSIS SCREENING SIRS Criteria (> = 2). Safety screen: Patient  dn8        feels safe.  .  Vital Signs:  11:54 BP 140 / 84; Pulse 98; Resp 18; Temp 36.4(TE); Pulse Ox 100% on dn8        R/A;  .  OB/GYN:  11:54 LMP 06/20/2020  dn8  Maury Dus Coma Score:  11:54 Eye Response: spontaneous(4). Verbal Response: oriented(5).     dn8        Motor Response: obeys commands(6). Total: 15.  .  Name:[name], [name]  QIH:[id number]  [id number]  Page 1 of 3  %%PAGE  .  Name: [name], [name]  MRN:  [id number]  Age: 11 yrs  Sex: Female  DOB: [birth year]  Arrival Date: 07/02/2020  Arrival Time: 11:44  Account#: [id number]  Bed D5  PCP: Alphonzo Grieve, Glee Arvin  Chief Complaint:  - Left foot injury  .  Marland Kitchen  Triage Assessment:  11:51 General: Appears comfortable, Behavior is cooperative,          dn8        pleasant. Pain: Complains of pain in left foot Pain currently        is 5/10 At worst was 8/10.  11:54 Respiratory: No deficits noted. Airway is patent Trachea        dn8        midline Respiratory effort is even, unlabored, Respiratory        pattern is regular, symmetrical. Musculoskeletal: Circulation,        motion, and sensation intact.  .  Assessment:  12:30 General: Appears in no apparent distress, well nourished, well  bb21        groomed. Pain: Complains of pain in left foot. Neuro: No        deficits noted. EENT: No deficits noted. Cardiovascular: No        deficits noted. Respiratory: No deficits noted. GI: No deficits        noted. GU: No deficits noted. Skin: Musculoskeletal:        Circulation, motion, and sensation intact Capillary refill < 3        seconds Range of motion limited in left ankle.  .  Observations:  11:44 Patient arrived in ED.                                          bh12  11:51 Triage Completed.                                               dn8  12:14 Patient Visited By: Miachel Roux                               mm10  13:00 Patient Visited By: Miachel Roux                               mm10  .  Interventions:  11:45 Driver's License Scanned into Chart                             bh12  12:30 Patient Interventions patient placed in exam room. Armband on.  bb21  12:37 Demo Sheet Scanned into Chart                                   es28  .  Outcome:  13:00 Discharge ordered by  MD.                                        mm10  13:10 Discharged to home. Condition: stable. Discharge instructions   bb21        given to patient, Instructed on: discharge instructions,        Demonstrated  understanding of instructions. Discharge        Assessment: Patient awake, alert and oriented x 3. No cognitive        and/or functional deficits noted. Patient verbalized        understanding of disposition instructions. Chart Status Nursing        note complete and electronically signed.  13:12 Patient left the ED.                                            bb21  .  Corrections: (The following items were deleted from the chart)  .  Name:[name], [name]  [id number]  [id number]  Page 2 of 3  %%PAGE  .  Name: [name], [name]  MRN: [id number]  Age: 13 yrs  Sex: Female  DOB: Oct 25, 2000  Arrival Date: 07/02/2020  Arrival Time: 11:44  Account#: [id number]  Bed D5  PCP: Alphonzo Grieve, Glee Arvin  Chief Complaint:  - Left foot injury  .  11:51 11:50 Acuity: Adult 4 dn8                                       dn8  .  Signatures:  Mostofi, Matt                           DO   mm10  Rebekah Chesterfield                      RN   bb21  Nicolosi, Domenic                       RN   dn8  Serrano-Veale, [employer], Britta Mccreedy                              [phone]  .  .  .  .  .  .  .  .  .  .  .  .  .  .  .  .  .  .  .  .  .  .  .  .  .  .  .  .  .  .  .  .  .  .  .  Name:[name], [name]  [id number]  [id number]  Page 3 of 3  .  %%END

## 2020-07-06 ENCOUNTER — Ambulatory Visit

## 2020-07-06 NOTE — Progress Notes (Signed)
* * *      [name], [name] **DOB:** 01-03-2001 (19 yo F) **Acc No.** [id number] **DOS:**  07/06/2020    ---       [name], [name]**    ------    39 Y old Female, DOB: 09/16/2001    [address], [place], [place] [address]    Home: [phone]    Provider: Sharlette Dense        * * *    Telephone Encounter    ---    Answered by  Carmin Richmond Date: 07/06/2020       Time: 11:03 AM    Message                     [name] was in the ED and was told to follow up this week.  I told her that the admin would reach out to her.      [phone]        ------            Action Taken                     [name],[name]  07/06/2020 11:06:24 AM > Called pt lvm to call back the office      [name],[name]  07/06/2020 11:54:14 AM > Spoke to pt she is scheduled to come in 9/16                    * * *                ---          * * *         Provider: Sharlette Dense 07/06/2020    ---    Note generated by eClinicalWorks EMR/PM Software ([email or web address])

## 2020-11-18 ENCOUNTER — Ambulatory Visit

## 2021-07-05 ENCOUNTER — Telehealth (HOSPITAL_BASED_OUTPATIENT_CLINIC_OR_DEPARTMENT_OTHER)

## 2021-07-05 NOTE — Telephone Encounter (Signed)
 Called and left vm to sched NP appt in [facility] as Dr. Jerolyn Shin no longer here at [facility]

## 2021-07-19 ENCOUNTER — Telehealth (HOSPITAL_BASED_OUTPATIENT_CLINIC_OR_DEPARTMENT_OTHER)

## 2021-07-19 NOTE — Telephone Encounter (Signed)
 Called and left vm to sched pe appt NP appt in [facility] or if has own provider

## 2021-10-04 ENCOUNTER — Telehealth (HOSPITAL_BASED_OUTPATIENT_CLINIC_OR_DEPARTMENT_OTHER)

## 2021-10-04 NOTE — Telephone Encounter (Signed)
 Called and left vm to update pcp

## 2021-11-17 ENCOUNTER — Telehealth (HOSPITAL_BASED_OUTPATIENT_CLINIC_OR_DEPARTMENT_OTHER)

## 2021-11-17 NOTE — Telephone Encounter (Signed)
Called and left vm to update pcp

## 2023-05-09 ENCOUNTER — Encounter: Payer: Self-pay | Admitting: Dermatology

## 2023-05-09 ENCOUNTER — Ambulatory Visit (INDEPENDENT_AMBULATORY_CARE_PROVIDER_SITE_OTHER): Payer: BC Managed Care – PPO | Admitting: Dermatology

## 2023-05-09 VITALS — BP 124/83 | HR 83

## 2023-05-09 DIAGNOSIS — L814 Other melanin hyperpigmentation: Secondary | ICD-10-CM | POA: Diagnosis not present

## 2023-05-09 DIAGNOSIS — D225 Melanocytic nevi of trunk: Secondary | ICD-10-CM | POA: Diagnosis not present

## 2023-05-09 DIAGNOSIS — D1801 Hemangioma of skin and subcutaneous tissue: Secondary | ICD-10-CM

## 2023-05-09 DIAGNOSIS — Z1283 Encounter for screening for malignant neoplasm of skin: Secondary | ICD-10-CM

## 2023-05-09 DIAGNOSIS — L821 Other seborrheic keratosis: Secondary | ICD-10-CM

## 2023-05-09 DIAGNOSIS — D235 Other benign neoplasm of skin of trunk: Secondary | ICD-10-CM

## 2023-05-09 DIAGNOSIS — D229 Melanocytic nevi, unspecified: Secondary | ICD-10-CM

## 2023-05-09 DIAGNOSIS — D492 Neoplasm of unspecified behavior of bone, soft tissue, and skin: Secondary | ICD-10-CM

## 2023-05-09 NOTE — Patient Instructions (Addendum)
Patient Handout: Wound Care for Skin Biopsy Site  Patient Handout: Wound Care for Skin Biopsy Site  Taking Care of Your Skin Biopsy Site  Proper care of the biopsy site is essential for promoting healing and minimizing scarring. This handout provides instructions on how to care for your biopsy site to ensure optimal recovery.  1. Cleaning the Wound:  Clean the biopsy site daily with gentle soap and water. Gently pat the area dry with a clean, soft towel. Avoid harsh scrubbing or rubbing the area, as this can irritate the skin and delay healing.  2. Applying Aquaphor and Bandage:  After cleaning the wound, apply a thin layer of Aquaphor ointment to the biopsy site. Cover the area with a sterile bandage to protect it from dirt, bacteria, and friction. Change the bandage daily or as needed if it becomes soiled or wet.  3. Continued Care for One Week:  Repeat the cleaning, Aquaphor application, and bandaging process daily for one week following the biopsy procedure. Keeping the wound clean and moist during this initial healing period will help prevent infection and promote optimal healing.  4. Massaging Aquaphor into the Area:  ---After one week, discontinue the use of bandages but continue to apply Aquaphor to the biopsy site. ----Gently massage the Aquaphor into the area using circular motions. ---Massaging the skin helps to promote circulation and prevent the formation of scar tissue.   Additional Tips:  Avoid exposing the biopsy site to direct sunlight during the healing process, as this can cause hyperpigmentation or worsen scarring. If you experience any signs of infection, such as increased redness, swelling, warmth, or drainage from the wound, contact your healthcare provider immediately. Follow any additional instructions provided by your healthcare provider for caring for the biopsy site and managing any discomfort. Conclusion:  Taking proper care of your skin biopsy site  is crucial for ensuring optimal healing and minimizing scarring. By following these instructions for cleaning, applying Aquaphor, and massaging the area, you can promote a smooth and successful recovery. If you have any questions or concerns about caring for your biopsy site, don't hesitate to contact your healthcare provider for guidance.      Due to recent changes in healthcare laws, you may see results of your pathology and/or laboratory studies on MyChart before the doctors have had a chance to review them. We understand that in some cases there may be results that are confusing or concerning to you. Please understand that not all results are received at the same time and often the doctors may need to interpret multiple results in order to provide you with the best plan of care or course of treatment. Therefore, we ask that you please give us 2 business days to thoroughly review all your results before contacting the office for clarification. Should we see a critical lab result, you will be contacted sooner.   If You Need Anything After Your Visit  If you have any questions or concerns for your doctor, please call our main line at [phone] If no one answers, please leave a voicemail as directed and we will return your call as soon as possible. Messages left after 4 pm will be answered the following business day.   You may also send us a message via MyChart. We typically respond to MyChart messages within 1-2 business days.  For prescription refills, please ask your pharmacy to contact our office. Our fax number is [phone].  If you have an urgent issue when the clinic is   closed that cannot wait until the next business day, you can page your doctor at the number below.    Please note that while we do our best to be available for urgent issues outside of office hours, we are not available 24/7.   If you have an urgent issue and are unable to reach us, you may choose to seek medical care  at your doctor's office, retail clinic, urgent care center, or emergency room.  If you have a medical emergency, please immediately call 911 or go to the emergency department. In the event of inclement weather, please call our main line at [phone] for an update on the status of any delays or closures.  Dermatology Medication Tips: Please keep the boxes that topical medications come in in order to help keep track of the instructions about where and how to use these. Pharmacies typically print the medication instructions only on the boxes and not directly on the medication tubes.   If your medication is too expensive, please contact our office at [phone] or send us a message through MyChart.   We are unable to tell what your co-pay for medications will be in advance as this is different depending on your insurance coverage. However, we may be able to find a substitute medication at lower cost or fill out paperwork to get insurance to cover a needed medication.   If a prior authorization is required to get your medication covered by your insurance company, please allow us 1-2 business days to complete this process.  Drug prices often vary depending on where the prescription is filled and some pharmacies may offer cheaper prices.  The website www.goodrx.com contains coupons for medications through different pharmacies. The prices here do not account for what the cost may be with help from insurance (it may be cheaper with your insurance), but the website can give you the price if you did not use any insurance.  - You can print the associated coupon and take it with your prescription to the pharmacy.  - You may also stop by our office during regular business hours and pick up a GoodRx coupon card.  - If you need your prescription sent electronically to a different pharmacy, notify our office through [place] MyChart or by phone at [phone]     

## 2023-05-09 NOTE — Progress Notes (Signed)
   New Patient Visit   Subjective  Aimee Stewart is a 22 y.o. female who presents for the following: Skin Cancer Screening and Full Body Skin Exam. No personal or family Hx of skin cancer, dysplastic nevi, melanoma. PCP was concerned about dark irregular moles on lower back.   The patient presents for Total-Body Skin Exam (TBSE) for skin cancer screening and mole check. The patient has spots, moles and lesions to be evaluated, some may be new or changing and the patient may have concern these could be cancer.  This patient is accompanied in the office by her mother.   The following portions of the chart were reviewed this encounter and updated as appropriate: medications, allergies, medical history  Review of Systems:  No other skin or systemic complaints except as noted in HPI or Assessment and Plan.  Objective  Well appearing patient in no apparent distress; mood and affect are within normal limits.  A full examination was performed including scalp, head, eyes, ears, nose, lips, neck, chest, axillae, abdomen, back, buttocks, bilateral upper extremities, bilateral lower extremities, hands, feet, fingers, toes, fingernails, and toenails. All findings within normal limits unless otherwise noted below.   Relevant physical exam findings are noted in the Assessment and Plan.  Left Lower Back 1 cm black-brown papule         Assessment & Plan   SKIN CANCER SCREENING PERFORMED TODAY.   LENTIGINES, HEMANGIOMAS - Benign normal skin lesions - Benign-appearing - Call for any changes  MELANOCYTIC NEVI - Tan-brown and/or pink-flesh-colored symmetric macules and papules - Benign appearing on exam today - Observation - Call clinic for new or changing moles - Recommend daily use of broad spectrum spf 30+ sunscreen to sun-exposed areas.   Congenital nevus Right thigh. 6 mm   Treatment: Benign-appearing.  Observation.  Call clinic for new or changing lesions.  Recommend daily use  of broad spectrum spf 30+ sunscreen to sun-exposed areas.    Blue Nevus Blue gray lesion at superior left gluteal cleft.  Treatment: Benign-appearing.  Observation.  Call clinic for new or changing lesions.  Recommend daily use of broad spectrum spf 30+ sunscreen to sun-exposed areas.     PROCEDURE NOTE  Neoplasm of skin Left Lower Back  Epidermal / dermal shaving  Lesion diameter (cm):  1 Informed consent: discussed and consent obtained   Patient was prepped and draped in usual sterile fashion: Area prepped with alcohol. Anesthesia: the lesion was anesthetized in a standard fashion   Anesthetic:  1% lidocaine w/ epinephrine 1-100,000 buffered w/ 8.4% NaHCO3 Instrument used: DermaBlade   Hemostasis achieved with: pressure and aluminum chloride   Outcome: patient tolerated procedure well   Post-procedure details: wound care instructions given   Post-procedure details comment:  Ointment and small bandage applied    Return in about 2 years (around 05/08/2025) for TBSE.  I, Lawson Radar, CMA, am acting as scribe for [employer], DO.   Documentation: I have reviewed the above documentation for accuracy and completeness, and I agree with the above.  Langston Reusing, DO

## 2023-05-17 ENCOUNTER — Encounter: Payer: Self-pay | Admitting: Dermatology

## 2023-05-23 ENCOUNTER — Telehealth: Payer: Self-pay

## 2023-05-23 NOTE — Telephone Encounter (Signed)
Sent pt my chart message on watching area for infection

## 2023-06-01 NOTE — Progress Notes (Signed)
[facility]  Dr. Onalee Hua reviewed your biopsy results and they showed the spot removed was benign (not cancerous).  No additional treatment is required.  The detailed report is available to view in MyChart.  Have a great day!  Kind Regards,  Dr. Kermit Balo Care Team

## 2023-09-09 ENCOUNTER — Other Ambulatory Visit: Payer: Self-pay

## 2023-09-09 ENCOUNTER — Ambulatory Visit
Admission: EM | Admit: 2023-09-09 | Discharge: 2023-09-09 | Disposition: A | Discharge status: Home / Self Care | Payer: BC Managed Care – PPO | Attending: Family Medicine | Admitting: Family Medicine

## 2023-09-09 DIAGNOSIS — N3001 Acute cystitis with hematuria: Secondary | ICD-10-CM | POA: Diagnosis not present

## 2023-09-09 DIAGNOSIS — R1030 Lower abdominal pain, unspecified: Secondary | ICD-10-CM

## 2023-09-09 DIAGNOSIS — R300 Dysuria: Secondary | ICD-10-CM | POA: Diagnosis present

## 2023-09-09 DIAGNOSIS — R112 Nausea with vomiting, unspecified: Secondary | ICD-10-CM | POA: Diagnosis present

## 2023-09-09 DIAGNOSIS — R1011 Right upper quadrant pain: Secondary | ICD-10-CM | POA: Diagnosis present

## 2023-09-09 LAB — POCT URINALYSIS DIP (MANUAL ENTRY)
Glucose, UA: NEGATIVE mg/dL
Ketones, POC UA: NEGATIVE mg/dL
Nitrite, UA: NEGATIVE
Spec Grav, UA: >=1.03 — AB (ref 1.010–1.025)
Urobilinogen, UA: 0.2 U/dL
pH, UA: 6 (ref 5.0–8.0)

## 2023-09-09 MED ORDER — CEFDINIR 300 MG PO CAPS: 300.0000 mg | ORAL_CAPSULE | Freq: Two times a day (BID) | ORAL | 0 refills | Status: AC

## 2023-09-09 NOTE — ED Provider Notes (Signed)
[name]    CSN: [id number] Arrival date & time: 09/09/23  1457      History   Chief Complaint Chief Complaint  Patient presents with   Dysuria   Nausea   Abdominal Pain    HPI Aimee Stewart is a 22 y.o. female.   HPI 22 year old female presents with dysuria and abdominal cramping on the right side.  Patient reports possibly constipated and reports taking stool softener with no relief.  Patient is accompanied by her mother this afternoon.  No past medical history on file.  There are no problems to display for this patient.   No past surgical history on file.  OB History   No obstetric history on file.      Home Medications    Prior to Admission medications   Medication Sig Start Date End Date Taking? Authorizing Provider  cefdinir (OMNICEF) 300 MG capsule Take 1 capsule (300 mg total) by mouth 2 (two) times daily for 7 days. 09/09/23 09/16/23 Yes Trevor Iha, FNP  buPROPion ER [facility] SR) 100 MG 12 hr tablet Take 100 mg by mouth every morning. 03/09/23   [provider]  cetirizine (ZYRTEC) 10 MG tablet Take by mouth.    [provider]  cetirizine (ZYRTEC) 5 MG chewable tablet Chew 5 mg by mouth.    [provider]  escitalopram (LEXAPRO) 10 MG tablet Take 1 tablet by mouth daily. 05/30/18   [provider]  ipratropium (ATROVENT) 0.06 % nasal spray Place 2 sprays into both nostrils 4 (four) times daily. 01/16/23   [provider]  Norethindrone Acetate-Ethinyl Estradiol (LOESTRIN) 1.5-30 MG-MCG tablet Take 1 tablet by mouth daily. 03/28/23   [provider]    Family History No family history on file.  Social History Social History   Tobacco Use   Smoking status: Never     Allergies   Patient has no known allergies.   Review of Systems Review of Systems  Gastrointestinal:  Positive for constipation.       Abdominal cramping  Genitourinary:  Positive for dysuria.  All other  systems reviewed and are negative.    Physical Exam Triage Vital Signs ED Triage Vitals [09/09/23 1513]  Encounter Vitals Group     BP      Systolic BP Percentile      Diastolic BP Percentile      Pulse      Resp      Temp      Temp src      SpO2      Weight      Height      Head Circumference      Peak Flow      Pain Score 5     Pain Loc      Pain Education      Exclude from Growth Chart    No data found.  Updated Vital Signs BP (!) 137/91   Pulse 85   Temp 98.2 F (36.8 C)   Resp 16   LMP 08/22/2023   SpO2 98%    Physical Exam Vitals and nursing note reviewed.  Constitutional:      General: She is not in acute distress.    Appearance: Normal appearance. She is normal weight. She is not ill-appearing.  HENT:     Head: Normocephalic and atraumatic.     Mouth/Throat:     Mouth: Mucous membranes are moist.     Pharynx: Oropharynx is clear.  Eyes:  Extraocular Movements: Extraocular movements intact.     Conjunctiva/sclera: Conjunctivae normal.     Pupils: Pupils are equal, round, and reactive to light.  Cardiovascular:     Rate and Rhythm: Normal rate and regular rhythm.     Pulses: Normal pulses.     Heart sounds: Normal heart sounds.  Pulmonary:     Effort: Pulmonary effort is normal.     Breath sounds: Normal breath sounds. No wheezing, rhonchi or rales.  Musculoskeletal:        General: Normal range of motion.     Cervical back: Normal range of motion and neck supple.  Skin:    General: Skin is warm and dry.  Neurological:     General: No focal deficit present.     Mental Status: She is alert and oriented to person, place, and time. Mental status is at baseline.  Psychiatric:        Mood and Affect: Mood normal.        Behavior: Behavior normal.      UC Treatments / Results  Labs (all labs ordered are listed, but only abnormal results are displayed) Labs Reviewed  POCT URINALYSIS DIP (MANUAL ENTRY) - Abnormal; Notable for the following  components:      Result Value   Bilirubin, UA small (*)    Spec Grav, UA >=1.030 (*)    Blood, UA small (*)    Protein Ur, POC trace (*)    Leukocytes, UA Trace (*)    All other components within normal limits  URINE CULTURE    EKG   Radiology No results found.  Procedures Procedures (including critical care time)  Medications Ordered in UC Medications - No data to display  Initial Impression / Assessment and Plan / UC Course  I have reviewed the triage vital signs and the nursing notes.  Pertinent labs & imaging results that were available during my care of the patient were reviewed by me and considered in my medical decision making (see chart for details).     MDM: 1.  Acute cystitis with hematuria-UA revealed above, urine culture ordered, Rx'd cefdinir 300 mg capsule: Take 1 capsule twice daily x 7 days; 2.  Dysuria-UA revealed above, urine culture ordered, Rx'd cefdinir 300 mg capsule take 1 capsule twice daily x 7 days. Advised patient to take medication as directed with food to completion.  Encouraged increase daily water intake to 64 ounces per day while taking this medication.  Advised we will follow-up with urine culture results once received.  Advised if symptoms worsen and/or unresolved please follow-up with local ED, PCP, or here for further evaluation.  Discharged home, hemodynamically stable. Final Clinical Impressions(s) / UC Diagnoses   Final diagnoses:  Acute cystitis with hematuria  Dysuria     Discharge Instructions      Advised patient to take medication as directed with food to completion.  Encouraged increase daily water intake to 64 ounces per day while taking this medication.  Advised we will follow-up with urine culture results once received.  Advised if symptoms worsen and/or unresolved please follow-up with local ED, PCP, or here for further evaluation.     ED Prescriptions     Medication Sig Dispense Auth. Provider   cefdinir (OMNICEF) 300  MG capsule Take 1 capsule (300 mg total) by mouth 2 (two) times daily for 7 days. 14 capsule Trevor Iha, FNP      PDMP not reviewed this encounter.   Trevor Iha, FNP 09/09/23 1544

## 2023-09-09 NOTE — Discharge Instructions (Addendum)
Advised patient to take medication as directed with food to completion.  Encouraged increase daily water intake to 64 ounces per day while taking this medication.  Advised we will follow-up with urine culture results once received.  Advised if symptoms worsen and/or unresolved please follow-up with local ED, PCP, or here for further evaluation.

## 2023-09-09 NOTE — ED Triage Notes (Signed)
Pt presents to uc with co of cramping in abdomin and right side since last night. Pt reports she are out last night and since then has had these symptoms. Pt reports difficulty urinating as well with nausea, and vomiting. Pt reports she has been constipated since yesterday and took a suppository without any improvement.

## 2023-09-10 ENCOUNTER — Telehealth: Payer: Self-pay

## 2023-09-10 LAB — URINE CULTURE: Culture: 30000 — AB
